# Patient Record
Sex: Male | Born: 1995 | Race: White | Hispanic: No | Marital: Single | Smoking: Current every day smoker
Health system: Southern US, Community
[De-identification: ages and names within clinical notes are randomized; demographics above are authoritative.]

## PROBLEM LIST (undated history)

## (undated) DIAGNOSIS — J45909 Unspecified asthma, uncomplicated: Secondary | ICD-10-CM

## (undated) HISTORY — PX: OTHER SURGICAL HISTORY: SHX169

---

## 2017-04-21 ENCOUNTER — Emergency Department (HOSPITAL_COMMUNITY)
Admission: EM | Admit: 2017-04-21 | Discharge: 2017-04-22 | Disposition: A | Discharge status: Home / Self Care | Payer: Medicaid Other | Attending: Emergency Medicine | Admitting: Emergency Medicine

## 2017-04-21 ENCOUNTER — Encounter (HOSPITAL_COMMUNITY): Payer: Self-pay | Admitting: Emergency Medicine

## 2017-04-21 ENCOUNTER — Emergency Department (HOSPITAL_COMMUNITY): Payer: Medicaid Other

## 2017-04-21 DIAGNOSIS — F4325 Adjustment disorder with mixed disturbance of emotions and conduct: Secondary | ICD-10-CM | POA: Diagnosis present

## 2017-04-21 DIAGNOSIS — Z046 Encounter for general psychiatric examination, requested by authority: Secondary | ICD-10-CM | POA: Insufficient documentation

## 2017-04-21 DIAGNOSIS — F17200 Nicotine dependence, unspecified, uncomplicated: Secondary | ICD-10-CM | POA: Diagnosis not present

## 2017-04-21 DIAGNOSIS — S61411A Laceration without foreign body of right hand, initial encounter: Secondary | ICD-10-CM | POA: Insufficient documentation

## 2017-04-21 DIAGNOSIS — Y9389 Activity, other specified: Secondary | ICD-10-CM | POA: Diagnosis not present

## 2017-04-21 DIAGNOSIS — Z23 Encounter for immunization: Secondary | ICD-10-CM | POA: Diagnosis not present

## 2017-04-21 DIAGNOSIS — S65911A Laceration of unspecified blood vessel at wrist and hand level of right arm, initial encounter: Secondary | ICD-10-CM | POA: Diagnosis present

## 2017-04-21 DIAGNOSIS — Z720 Tobacco use: Secondary | ICD-10-CM

## 2017-04-21 DIAGNOSIS — W25XXXA Contact with sharp glass, initial encounter: Secondary | ICD-10-CM | POA: Insufficient documentation

## 2017-04-21 DIAGNOSIS — F17210 Nicotine dependence, cigarettes, uncomplicated: Secondary | ICD-10-CM | POA: Diagnosis not present

## 2017-04-21 DIAGNOSIS — R45851 Suicidal ideations: Secondary | ICD-10-CM | POA: Diagnosis not present

## 2017-04-21 DIAGNOSIS — Y929 Unspecified place or not applicable: Secondary | ICD-10-CM | POA: Insufficient documentation

## 2017-04-21 DIAGNOSIS — Z630 Problems in relationship with spouse or partner: Secondary | ICD-10-CM | POA: Diagnosis not present

## 2017-04-21 DIAGNOSIS — Z008 Encounter for other general examination: Secondary | ICD-10-CM | POA: Insufficient documentation

## 2017-04-21 DIAGNOSIS — Y998 Other external cause status: Secondary | ICD-10-CM | POA: Insufficient documentation

## 2017-04-21 HISTORY — DX: Unspecified asthma, uncomplicated: J45.909

## 2017-04-21 LAB — COMPREHENSIVE METABOLIC PANEL
ALBUMIN: 4.9 g/dL (ref 3.5–5.0)
ALK PHOS: 67 U/L (ref 38–126)
ALT: 15 U/L — AB (ref 17–63)
AST: 23 U/L (ref 15–41)
Anion gap: 7 (ref 5–15)
BUN: 11 mg/dL (ref 6–20)
CALCIUM: 9.7 mg/dL (ref 8.9–10.3)
CHLORIDE: 107 mmol/L (ref 101–111)
CO2: 28 mmol/L (ref 22–32)
CREATININE: 1.03 mg/dL (ref 0.61–1.24)
GFR calc Af Amer: >60 mL/min (ref >60)
GFR calc non Af Amer: >60 mL/min (ref >60)
GLUCOSE: 85 mg/dL (ref 65–99)
Potassium: 3.4 mmol/L — ABNORMAL LOW (ref 3.5–5.1)
SODIUM: 142 mmol/L (ref 135–145)
Total Bilirubin: 0.8 mg/dL (ref 0.3–1.2)
Total Protein: 7.8 g/dL (ref 6.5–8.1)

## 2017-04-21 LAB — CBC WITH DIFFERENTIAL/PLATELET
BASOS ABS: 0 10*3/uL (ref 0.0–0.1)
BASOS PCT: 0 %
EOS ABS: 0.1 10*3/uL (ref 0.0–0.7)
Eosinophils Relative: 1 %
HCT: 40.9 % (ref 39.0–52.0)
HEMOGLOBIN: 14 g/dL (ref 13.0–17.0)
Lymphocytes Relative: 23 %
Lymphs Abs: 1.2 10*3/uL (ref 0.7–4.0)
MCH: 31 pg (ref 26.0–34.0)
MCHC: 34.2 g/dL (ref 30.0–36.0)
MCV: 90.7 fL (ref 78.0–100.0)
Monocytes Absolute: 0.4 10*3/uL (ref 0.1–1.0)
Monocytes Relative: 7 %
NEUTROS PCT: 69 %
Neutro Abs: 3.8 10*3/uL (ref 1.7–7.7)
Platelets: 179 10*3/uL (ref 150–400)
RBC: 4.51 MIL/uL (ref 4.22–5.81)
RDW: 12.9 % (ref 11.5–15.5)
WBC: 5.5 10*3/uL (ref 4.0–10.5)

## 2017-04-21 LAB — ETHANOL: Alcohol, Ethyl (B): <5 mg/dL (ref <5)

## 2017-04-21 LAB — SALICYLATE LEVEL: Salicylate Lvl: <7 mg/dL (ref 2.8–30.0)

## 2017-04-21 LAB — ACETAMINOPHEN LEVEL

## 2017-04-21 MED ORDER — ALUM & MAG HYDROXIDE-SIMETH 200-200-20 MG/5ML PO SUSP: 30.0000 mL | Freq: Four times a day (QID) | ORAL | Status: DC | PRN

## 2017-04-21 MED ORDER — TETANUS-DIPHTH-ACELL PERTUSSIS 5-2.5-18.5 LF-MCG/0.5 IM SUSP
0.5000 mL | Freq: Once | INTRAMUSCULAR | Status: AC
  Administered 2017-04-21: 0.5 mL via INTRAMUSCULAR
  Filled 2017-04-21: qty 0.5

## 2017-04-21 MED ORDER — ZOLPIDEM TARTRATE 5 MG PO TABS
5.0000 mg | ORAL_TABLET | Freq: Every evening | ORAL | Status: DC | PRN
  Administered 2017-04-21: 5 mg via ORAL
  Filled 2017-04-21: qty 1

## 2017-04-21 MED ORDER — ONDANSETRON HCL 4 MG PO TABS: 4.0000 mg | ORAL_TABLET | Freq: Three times a day (TID) | ORAL | Status: DC | PRN

## 2017-04-21 MED ORDER — NICOTINE 21 MG/24HR TD PT24: 21.0000 mg | MEDICATED_PATCH | Freq: Every day | TRANSDERMAL | Status: DC

## 2017-04-21 MED ORDER — IBUPROFEN 200 MG PO TABS: 600.0000 mg | ORAL_TABLET | Freq: Three times a day (TID) | ORAL | Status: DC | PRN

## 2017-04-21 NOTE — BHH Counselor (Signed)
Clinician spoke to Bruce Gibson, [place] to discuss the pt's disposition. Clinician noted there is a picture of the conversation between the pt and his now ex-girlfriend, under chart review then click the media tab.   [name], MS, [facility] Triage Specialist [phone]

## 2017-04-21 NOTE — ED Notes (Signed)
Report given to Melinda in TCU 

## 2017-04-21 NOTE — ED Notes (Signed)
EDP at bedside  

## 2017-04-21 NOTE — ED Triage Notes (Signed)
Patient here with complaints of right had laceration after punching a mirror. Bleeding controlled. Laceration noted to right middle finger.

## 2017-04-21 NOTE — ED Provider Notes (Signed)
[facility]-EMERGENCY DEPT Provider Note   CSN: [id number] Arrival date & time: 04/21/17  1752     History   Chief Complaint Chief Complaint  Patient presents with  . Extremity Laceration    HPI Bruce Gibson is a 21 y.o. Male with a PMHx of asthma, who presents to the ED with complaints of right hand laceration sustained about 30 minutes prior to arrival. Patient was angry after his girlfriend broke up with him so he punched a mirror, which subsequently broke. He sustained a small laceration between his third and fourth MCP joints on the right hand, and other very small superficial abrasions to the remainder of the hand. He describes the pain as 3/10 constant stinging nonradiating R hand pain around the laceration, worse with making a fist, and with no treatments tried prior to arrival. He is right-handed. He denies use of blood thinners or any bleeding disorders. Denies ongoing bleeding at this time. He denies any swelling, bruising, numbness, tingling, focal weakness, or any other complaints at this time. Denies any other injuries sustained.  ADDENDUM: at around 7:00pm, pt's roommate arrived to the ED stating that the pt's (now ex-)girlfriend texted him stating that the pt had sent suicidal threats to her. Pt's girlfriend then came to the ED around 7:30pm and showed [place] the texts, which stated "I'm gonna blow my brains out" and "I hurt you the thing I love most so why shouldn't I hurt myself". When asked, the pt denies SI and denies making any suicidal threats to anyone, however she was able to provide the text messages that showed that he sent that to her.   Later on when he was confronted about these texts, he admitted that he sent them and that it was because he was upset. Denies HI/AVH, and denies prior SI. Doesn't take medications. +Smoker. Admits to EtOH use occasionally, on the weekends, with the last use being yesterday. Denies illicit drug use. Denies any other medical complaints at this  time.    The history is provided by the patient and medical records. No language interpreter was used.  Laceration   The incident occurred less than 1 hour ago. The laceration is located on the right hand. The laceration is 2 cm in size. The laceration mechanism was a broken glass. The pain is at a severity of 3/10. The pain is mild. The pain has been constant since onset. He reports no foreign bodies present. His tetanus status is unknown.    Past Medical History:  Diagnosis Date  . Asthma     There are no active problems to display for this patient.   No past surgical history on file.     Home Medications    Prior to Admission medications   Not on File    Family History No family history on file.  Social History Social History  Substance Use Topics  . Smoking status: Current Some Day Smoker  . Smokeless tobacco: Never Used  . Alcohol use No     Allergies   Patient has no allergy information on record.   Review of Systems Review of Systems  Constitutional: Negative for chills and fever.  Respiratory: Negative for shortness of breath.   Cardiovascular: Negative for chest pain.  Gastrointestinal: Negative for abdominal pain, constipation, diarrhea, nausea and vomiting.  Genitourinary: Negative for dysuria and hematuria.  Musculoskeletal: Positive for arthralgias. Negative for joint swelling.  Skin: Positive for wound. Negative for color change.  Allergic/Immunologic: Negative for immunocompromised state.  Neurological:  Negative for weakness and numbness.  Hematological: Does not bruise/bleed easily.  Psychiatric/Behavioral: Positive for self-injury and suicidal ideas (per girlfriend based on texts he sent her, and later he admits making threats). Negative for confusion and hallucinations.  All other systems reviewed and are negative for acute change except as noted in the HPI.     Physical Exam Updated Vital Signs BP (!) 138/95 (BP Location: Right Arm)    Pulse (!) 104   Temp 98.4 F (36.9 C) (Oral)   Resp 18   SpO2 100%   Physical Exam  Constitutional: He is oriented to person, place, and time. Vital signs are normal. He appears well-developed and well-nourished.  Non-toxic appearance. No distress.  Afebrile, nontoxic, NAD  HENT:  Head: Normocephalic and atraumatic.  Mouth/Throat: Oropharynx is clear and moist and mucous membranes are normal.  Eyes: Conjunctivae and EOM are normal. Right eye exhibits no discharge. Left eye exhibits no discharge.  Neck: Normal range of motion. Neck supple.  Cardiovascular: Normal rate, regular rhythm, normal heart sounds and intact distal pulses.  Exam reveals no gallop and no friction rub.   No murmur heard. Pulmonary/Chest: Effort normal and breath sounds normal. No respiratory distress. He has no decreased breath sounds. He has no wheezes. He has no rhonchi. He has no rales.  Abdominal: Soft. Normal appearance and bowel sounds are normal. He exhibits no distension. There is no tenderness. There is no rigidity, no rebound, no guarding, no CVA tenderness, no tenderness at McBurney's point and negative Murphy's sign.  Musculoskeletal: Normal range of motion.       Right hand: He exhibits tenderness and laceration. He exhibits normal range of motion, no bony tenderness, normal capillary refill, no deformity and no swelling. Normal sensation noted. Normal strength noted.  R hand with small laceration between 3rd and 4th MCP joints, fairly superficial with one edge of the wound missing a portion of skin, and the other edge of the wound has two small skin flaps still attached and covering over the slightly deeper area underlying it; no retained FBs, no ongoing bleeding, no swelling or bruising. Small scattered abrasions to remainder of hand. FROM intact in all digits, no crepitus or deformity, no focal bony TTP, only mild TTP over the wound. Strength and sensation grossly intact, distal pulses intact, compartments  soft.  SEE PICTURE BELOW  Neurological: He is alert and oriented to person, place, and time. He has normal strength. No sensory deficit.  Skin: Skin is warm and dry. Abrasion and laceration noted. No rash noted.  R hand wounds as mentioned above and pictured below  Psychiatric: He is not actively hallucinating. He exhibits a depressed mood. He expresses suicidal ideation. He expresses no homicidal ideation. He expresses suicidal plans. He expresses no homicidal plans.  Depressed affect. Initially denies SI despite texting his girlfriend suicidal threats, then admits sending suicidal threats. Denies HI or AVH, doesn't seem to be responding to internal stimuli.   Nursing note and vitals reviewed.   AFTER SKIN FLAP STRAIGHTENED OUT:    BEFORE SKIN FLAP STRAIGHTENED OUT:     ED Treatments / Results  Labs (all labs ordered are listed, but only abnormal results are displayed) Labs Reviewed  COMPREHENSIVE METABOLIC PANEL - Abnormal; Notable for the following:       Result Value   Potassium 3.4 (*)    ALT 15 (*)    All other components within normal limits  ACETAMINOPHEN LEVEL - Abnormal; Notable for the following:  Acetaminophen (Tylenol), Serum <10 (*)    All other components within normal limits  CBC WITH DIFFERENTIAL/PLATELET  ETHANOL  SALICYLATE LEVEL  RAPID URINE DRUG SCREEN, HOSP PERFORMED    EKG  EKG Interpretation None       Radiology Dg Hand Complete Right  Result Date: 04/21/2017 CLINICAL DATA:  Laceration after trauma. EXAM: RIGHT HAND - COMPLETE 3+ VIEW COMPARISON:  None. FINDINGS: There is no evidence of fracture or dislocation. There is no evidence of arthropathy or other focal bone abnormality. Soft tissues are unremarkable. IMPRESSION: Negative. Electronically Signed   By: Gerome Sam III M.D   On: 04/21/2017 18:51    Procedures Procedures (including critical care time)  Medications Ordered in ED Medications  ibuprofen (ADVIL,MOTRIN) tablet 600 mg  (not administered)  zolpidem (AMBIEN) tablet 5 mg (not administered)  ondansetron (ZOFRAN) tablet 4 mg (not administered)  alum & mag hydroxide-simeth (MAALOX/MYLANTA) 200-200-20 MG/5ML suspension 30 mL (not administered)  nicotine (NICODERM CQ - dosed in mg/24 hours) patch 21 mg (not administered)  Tdap (BOOSTRIX) injection 0.5 mL (0.5 mLs Intramuscular Given 04/21/17 1847)     Initial Impression / Assessment and Plan / ED Course  I have reviewed the triage vital signs and the nursing notes.  Pertinent labs & imaging results that were available during my care of the patient were reviewed by me and considered in my medical decision making (see chart for details).     21 y.o. male here with small laceration to R hand between 3rd and 4th knuckles after punching a mirror in anger. Mild pain, no swelling or bruising, no focal bony tenderness, but mild TTP over the wound, fairly superficial wound with slightly deeper portion where a skin flap is still attached, and then a superficial portion where the skin has been removed entirely. Multiple smaller abrasions to remainder of hand. No retained FBs visualized in wound bed. NVI with soft compartments, FROM intact in all digits. Will obtain xray, update Tdap, and cleanse wound. Doubt need for wound closure, it will likely need to heal by secondary intent given the fact that there are missing skin flaps, and the flap that remains covers the deeper portion of the wound, which is still fairly superficial. Pt declines wanting pain meds. Will reassess shortly.   7:45 PM Xray negative, no retained FBs or osseous injury. Wound should heal well on its own by secondary intent, not amendable to sutures or dermabond. Cleansed and dressed.  During the interim time however, pt's roommate came and advised that pt had sent suicidal threats to his (now ex-)girlfriend. Pt's ex-girlfriend Bruce Gibson [phone]) was able to come and show the texts to [place] (which are loaded  under media tab as a clinical photo), and he in fact did make suicidal threats. He initially denies SI and denies making any threats when he's asked, but later when he's confronted about it, he admits it. He has a depressed affect. Given this information, IVC paperwork has been done and we will start the psychiatric clearance. Pt understands and is willing to be evaluated. IVC paperwork still sent in at this time. Will get clearance labs done. Will reassess shortly.   9:42 PM EtOH level undetectable. CBC w/diff and CMP essentially unremarkable, K slightly low at 3.4 but should not need repletion. Salicylate and acetaminophen levels WNL.  UDS pending, but does not interfere with med clearance. Pt medically cleared at this time. Psych hold orders and home med orders placed. Please see TTS notes for further  documentation of care/dispo. Pt stable at time of med clearance.     Final Clinical Impressions(s) / ED Diagnoses   Final diagnoses:  Laceration of right hand without foreign body, initial encounter  Suicidal ideations  Medical clearance for psychiatric admission  Tobacco user  Involuntary commitment    New Prescriptions New Prescriptions   No medications on [address], [place], [place] 04/21/17 2143    Tegeler, Canary Brim, MD 04/22/17 [phone]

## 2017-04-21 NOTE — ED Notes (Signed)
Bed: [facility] Expected date:  Expected time:  Means of arrival:  Comments: [place]

## 2017-04-21 NOTE — BHH Counselor (Signed)
Per pt's RN, pt's mother would like the doctor call her in the morning. Per RN pt has given is consent to contact his mother.   Bruce Gibson, mother, [phone].  [name], MS, [facility], [facility] Triage Specialist [phone]

## 2017-04-21 NOTE — BH Assessment (Addendum)
Assessment Note  Bruce Gibson is an 21 y.o. male, who presents involuntary and unaccompanied to [facility]. Clinician asked the pt, "what brought you to the hospital?" Pt replied, "I punched a mirror and cut my hand up." Pt reported, because, "the girl I like broke up with me." Pt denied, SI, HI, AVH, self-injurious behaviors and access to weapons. Clinician expressed to the pt that she seen that he made threats against himself. Pt reported, he was just upset.   Pt's IVC paperwork was initiated by EDP. Per IVC paperwork: "Pt punched a mirror in anger his girlfriend broke up with his him. He then texted her that he was 'gonna blow (his) brains out' and 'I hurt you the thing I love the most so why shouldn't I hurt myself.' He [place] making suicidal threats to her via text , despite denying to medical staff. He is likely a threat to himself."   Pt denied abuse. Pt reported, this week he has been smoking seven cigarettes daily. Pt's UDS is pending. Pt denied being linked to OPT resources (medication management and counseling.) Pt denied previous inpatient admissions.   Pt presents quiet/awake in scrubs with logical/cohernet speech. Pt's eye contact was fair. Pt's mood was sad. Pt's affect was congruent with mood. Pt's thought process was relevant/coherent. Pt's judgement was unimpaired. Pt's concentration was normal. Pt's insight and impulse control are poor. Pt was oriented x3 (year, city and state.) Pt reported, if discharged from [facility] he could contract for safety.   Diagnosis: Major Depressed Disorder, Single, Severe without Psychotic Features.   Past Medical History:  Past Medical History:  Diagnosis Date  . Asthma     No past surgical history on file.  Family History: No family history on file.  Social History:  reports that he has been smoking.  He has never used smokeless tobacco. He reports that he does not drink alcohol. His drug history is not on file.  Additional Social History:  Alcohol /  Drug Use Pain Medications: See MAR  Prescriptions: See MAR Over the Counter: See MAR History of alcohol / drug use?:  (Pt's UDS is pending. )  CIWA: CIWA-Ar BP: (!) 138/95 Pulse Rate: (!) 104 COWS:    Allergies: No Known Allergies  Home Medications:  (Not in a hospital admission)  OB/GYN Status:  No LMP for male patient.  General Assessment Data TTS Assessment: In system Is this a Tele or Face-to-Face Assessment?: Face-to-Face Is this an Initial Assessment or a Re-assessment for this encounter?: Initial Assessment Marital status: Single Living Arrangements: Other (Comment) (with three roommates. ) Can pt return to current living arrangement?: Yes Admission Status: Involuntary Referral Source: Self/Family/Friend Insurance type: Self-pay     Crisis Care Plan Living Arrangements: Other (Comment) (with three roommates. ) Legal Guardian: Other: (Self) Name of Psychiatrist: NA Name of Therapist: NA  Education Status Is patient currently in school?: Yes Current Grade: Sophomore in college.  Highest grade of school patient has completed: Freshman in college. Name of school: [employer]. Contact person: NA  Risk to self with the past 6 months Suicidal Ideation: Yes-Currently Present (Per IVC however pt denies. ) Has patient been a risk to self within the past 6 months prior to admission? : Yes Suicidal Intent: Yes-Currently Present (Per IVC however pt denies. ) Has patient had any suicidal intent within the past 6 months prior to admission? : Yes (Per IVC however pt denies. ) Is patient at risk for suicide?: Yes Suicidal Plan?: Yes-Currently Present (Per IVC however  pt denies. ) Has patient had any suicidal plan within the past 6 months prior to admission? : No (Pt denies. ) Specify Current Suicidal Plan: Per IVC pt reported, he was "gonn blow his brains out."  Access to Means: No (Pt denies. ) What has been your use of drugs/alcohol within the last 12 months?: UDS is  pending. Previous Attempts/Gestures: No How many times?: 0 Other Self Harm Risks: Pt denies.  Triggers for Past Attempts: None known Intentional Self Injurious Behavior: Cutting (Pt cut his hand because he punched a mirror. ) Comment - Self Injurious Behavior: Pt cut his hand because he punched a mirror.  Family Suicide History: No Recent stressful life event(s): Other (Comment) (depression.) Persecutory voices/beliefs?: No Depression: Yes Depression Symptoms: Feeling worthless/self pity (sadness, low self-esteem. ) Substance abuse history and/or treatment for substance abuse?: No Suicide prevention information given to non-admitted patients: Not applicable  Risk to Others within the past 6 months Homicidal Ideation: No (Pt denies. ) Does patient have any lifetime risk of violence toward others beyond the six months prior to admission? : No Thoughts of Harm to Others: No Current Homicidal Intent: No Current Homicidal Plan: No Access to Homicidal Means: No Identified Victim: NA History of harm to others?: No (Pt denies. ) Assessment of Violence: None Noted Violent Behavior Description: NA Does patient have access to weapons?: No (Pt denies. ) Criminal Charges Pending?: No Does patient have a court date: No Is patient on probation?: No  Psychosis Hallucinations: None noted Delusions: None noted  Mental Status Report Appearance/Hygiene: In scrubs Eye Contact: Fair Motor Activity: Unremarkable Speech: Logical/coherent Level of Consciousness: Quiet/awake Mood: Sad Affect: Other (Comment) (congruent with mood. ) Anxiety Level: None Thought Processes: Relevant, Coherent Judgement: Unimpaired Orientation: Other (Comment) (year, city and state.) Obsessive Compulsive Thoughts/Behaviors: None  Cognitive Functioning Concentration: Normal Memory: Recent Intact IQ: Average Insight: Poor Impulse Control: Poor Appetite: Good Sleep: No Change Vegetative Symptoms:  None  ADLScreening [facility]) Patient's cognitive ability adequate to safely complete daily activities?: Yes Patient able to express need for assistance with ADLs?: Yes Independently performs ADLs?: Yes (appropriate for developmental age)  Prior Inpatient Therapy Prior Inpatient Therapy: No Prior Therapy Dates: NA Prior Therapy Facilty/Provider(s): NA Reason for Treatment: NA  Prior Outpatient Therapy Prior Outpatient Therapy: No Prior Therapy Dates: NA Prior Therapy Facilty/Provider(s): NA Reason for Treatment: NA Does patient have an ACCT team?: No Does patient have Intensive In-House Services?  : No Does patient have [facility] services? : No Does patient have P4CC services?: No  ADL Screening (condition at time of admission) Patient's cognitive ability adequate to safely complete daily activities?: Yes Is the patient deaf or have difficulty hearing?: No Does the patient have difficulty seeing, even when wearing glasses/contacts?: Yes (Pt reported, wearing contacts. ) Does the patient have difficulty concentrating, remembering, or making decisions?: Yes (Pt reported, sometimes. ) Patient able to express need for assistance with ADLs?: Yes Does the patient have difficulty dressing or bathing?: No Independently performs ADLs?: Yes (appropriate for developmental age) Does the patient have difficulty walking or climbing stairs?: No Weakness of Legs: None Weakness of Arms/Hands: None       Abuse/Neglect Assessment (Assessment to be complete while patient is alone) Physical Abuse: Denies (Pt denies.) Verbal Abuse: Denies (Pt denies.) Sexual Abuse: Denies (Pt denies. ) Exploitation of patient/patient's resources: Denies (Pt denies. ) Self-Neglect: Denies (Pt denies. )     Advance Directives (For Healthcare) Does Patient Have a Medical Advance Directive?: No  Additional Information 1:1 In Past 12 Months?: No CIRT Risk: No Elopement Risk: No Does patient  have medical clearance?: Yes     Disposition: Nira Conn, NP recommends inpatient treatment. Disposition discussed with France Ravens, PA and Juliette Alcide, Charity fundraiser. TTS to seek placement.    Disposition Initial Assessment Completed for this Encounter: Yes Disposition of Patient:  (Pending.)  On Site Evaluation by:   Reviewed with Physician: France Ravens, PA and Nira Conn, NP.    [name] 04/21/2017 11:04 PM   [name], MS, [facility], [facility] Triage Specialist [phone]

## 2017-04-21 NOTE — ED Notes (Addendum)
SBAR Report received from previous nurse. Pt received calm and visible on unit. Pt denies current SI/ HI, A/V H, depression, anxiety, or pain at this time, and appears otherwise stable and free of distress at this time. Written permission granted to give information to mother. When speaking to  the mother, she confirmed that pt does not have a history of depression, self harm, or any psych issues. Mother provided phone number and requests to be contacted in the morning by treatment team. Pt reminded of camera surveillance, q 15 min rounds, and rules of the milieu. Pt has contracted for safety, will continue to assess.

## 2017-04-22 DIAGNOSIS — F4325 Adjustment disorder with mixed disturbance of emotions and conduct: Secondary | ICD-10-CM | POA: Diagnosis not present

## 2017-04-22 DIAGNOSIS — Z630 Problems in relationship with spouse or partner: Secondary | ICD-10-CM

## 2017-04-22 DIAGNOSIS — F17210 Nicotine dependence, cigarettes, uncomplicated: Secondary | ICD-10-CM

## 2017-04-22 LAB — RAPID URINE DRUG SCREEN, HOSP PERFORMED
Amphetamines: NOT DETECTED
Barbiturates: NOT DETECTED
Benzodiazepines: NOT DETECTED
COCAINE: NOT DETECTED
OPIATES: NOT DETECTED
TETRAHYDROCANNABINOL: POSITIVE — AB

## 2017-04-22 MED ORDER — HYDROXYZINE HCL 25 MG PO TABS: 25.0000 mg | ORAL_TABLET | Freq: Four times a day (QID) | ORAL | 0 refills | Status: DC | PRN

## 2017-04-22 MED ORDER — HYDROXYZINE HCL 25 MG PO TABS: 25.0000 mg | ORAL_TABLET | Freq: Four times a day (QID) | ORAL | Status: DC | PRN

## 2017-04-22 NOTE — BH Assessment (Signed)
BHH Assessment Progress Note  Per Thedore Mins, MD, this pt does not require psychiatric hospitalization at this time.  Original IVC documents, initiated on 04/21/2017, were found on pt's chart, but no Findings and Custody Order was found, and after calling Ok Edwards, this Clinical research associate ascertained that documents had never been faxed to the [employer] office.  Pt is therefore under voluntary status.  Pt is to be discharged from [facility] with recommendation to follow up with the [facility] counseling center.  This has been included in pt's discharge instructions.  Pt's nurse, Morrie Sheldon, has been notified.  Pt has signed Consent to Release Information to the [facility] counseling center, and this writer placed a notification call, leaving a message on Shelby's voice mail.  Doylene Canning, MA Triage Specialist [phone]
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
## 2017-04-22 NOTE — Discharge Instructions (Signed)
For your behavioral health needs, you are advised to follow up with the counseling center at [facility].  Contact them at your earliest opportunity to schedule an intake appointment:       Counseling Center      [facility]      [facility] [facility] [place]      [address]      [place], [place] [address]      [phone]

## 2017-04-22 NOTE — BH Assessment (Signed)
[facility] Assessment Progress Note This Clinical research associate contacted patients mother (per patients request) Abiel Odeh [phone] to discuss patient's case. Mother stated she feels patient is currently not a threat to himself or others and further hospitalization she feels, would not be necessary . Mother states she has spoke with patient several times since patient was admitted and patient has contracted for safety with him expressing the desire to be discharged this date and follow up with a outpatient provider at [facility]. Mother informed this Clinical research associate that patient has a strong support system and is motivated for treatment. Patient is currently attending school at [employer] and has stated that he "needs to get back to his classes's" and is interested in receiving counseling and be evaluated for possible medication interventions. Patient contracts for safety and will be discharged later this date. Staff will assist with coordinating services at [facility] prior to discharge.

## 2017-04-22 NOTE — BHH Suicide Risk Assessment (Signed)
Suicide Risk Assessment  Discharge Assessment   [facility] Discharge Suicide Risk Assessment   Principal Problem: Adjustment disorder with mixed disturbance of emotions and conduct Discharge Diagnoses:  Patient Active Problem List   Diagnosis Date Noted  . Adjustment disorder with mixed disturbance of emotions and conduct [F43.25] 04/22/2017    Priority: High    Total Time spent with patient: 45 minutes   Musculoskeletal: Strength & Muscle Tone: within normal limits Gait & Station: normal Patient leans: N/A  Psychiatric Specialty Exam: Physical Exam  Constitutional: He is oriented to person, place, and time. He appears well-developed and well-nourished.  HENT:  Head: Normocephalic.  Neck: Normal range of motion.  Respiratory: Effort normal.  Musculoskeletal: Normal range of motion.  Neurological: He is alert and oriented to person, place, and time.  Psychiatric: He has a normal mood and affect. His speech is normal and behavior is normal. Judgment and thought content normal. Cognition and memory are normal.    Review of Systems  All other systems reviewed and are negative.   Blood pressure 124/76, pulse 71, temperature 98.1 F (36.7 C), temperature source Oral, resp. rate 18, SpO2 96 %.There is no height or weight on file to calculate BMI.  General Appearance: Casual  Eye Contact:  Good  Speech:  Normal Rate  Volume:  Normal  Mood:  sad  Affect:  Congruent  Thought Process:  Coherent and Descriptions of Associations: Intact  Orientation:  Full (Time, Place, and Person)  Thought Content:  WDL and Logical  Suicidal Thoughts:  No  Homicidal Thoughts:  No  Memory:  Immediate;   Good Recent;   Good Remote;   Good  Judgement:  Fair  Insight:  Good  Psychomotor Activity:  Normal  Concentration:  Concentration: Good and Attention Span: Good  Recall:  Good  Fund of Knowledge:  Good  Language:  Good  Akathisia:  No  Handed:  Right  AIMS (if indicated):     Assets:   Communication Skills Desire for Improvement Financial Resources/Insurance Housing Leisure Time Physical Health Resilience Social Support Talents/Skills Transportation Vocational/Educational  ADL's:  Intact  Cognition:  WNL  Sleep:      Mental Status Per Nursing Assessment::   On Admission:   upset when his girlfriend broke up with him  Demographic Factors:  Adolescent or young adult and Caucasian  Loss Factors: Loss of significant relationship  Historical Factors: NA  Risk Reduction Factors:   Sense of responsibility to family, Living with another person, especially a relative, Positive social support and Positive coping skills or problem solving skills  Continued Clinical Symptoms:  Sad, situational  Cognitive Features That Contribute To Risk:  None    Suicide Risk:  Minimal: No identifiable suicidal ideation.  Patients presenting with no risk factors but with morbid ruminations; may be classified as minimal risk based on the severity of the depressive symptoms    Plan Of Care/Follow-up recommendations:  Activity:  as tolerateed Diet:  heart healthy diet  Merridith Dershem, NP 04/22/2017, 10:54 AM

## 2017-04-22 NOTE — ED Notes (Addendum)
Pt d/c home per MD order. Pt denies SI/HI/AVH. Discharge summary reviewed with pt, pt verbalizes understanding. RX provided. Pt signed for personal property and property returned. Pt signed e-signature. Ambulatory off unit with MHT.

## 2017-07-14 ENCOUNTER — Other Ambulatory Visit: Payer: Self-pay

## 2017-07-14 ENCOUNTER — Emergency Department (HOSPITAL_COMMUNITY)
Admission: EM | Admit: 2017-07-14 | Discharge: 2017-07-14 | Disposition: A | Discharge status: Home / Self Care | Payer: Federal, State, Local not specified - PPO | Attending: Emergency Medicine | Admitting: Emergency Medicine

## 2017-07-14 ENCOUNTER — Emergency Department (HOSPITAL_COMMUNITY): Payer: Federal, State, Local not specified - PPO

## 2017-07-14 ENCOUNTER — Encounter (HOSPITAL_COMMUNITY): Payer: Self-pay | Admitting: Emergency Medicine

## 2017-07-14 DIAGNOSIS — R05 Cough: Secondary | ICD-10-CM | POA: Diagnosis not present

## 2017-07-14 DIAGNOSIS — F17210 Nicotine dependence, cigarettes, uncomplicated: Secondary | ICD-10-CM | POA: Insufficient documentation

## 2017-07-14 DIAGNOSIS — R0781 Pleurodynia: Secondary | ICD-10-CM | POA: Diagnosis not present

## 2017-07-14 DIAGNOSIS — J45909 Unspecified asthma, uncomplicated: Secondary | ICD-10-CM | POA: Insufficient documentation

## 2017-07-14 DIAGNOSIS — R0981 Nasal congestion: Secondary | ICD-10-CM | POA: Diagnosis not present

## 2017-07-14 MED ORDER — IBUPROFEN 800 MG PO TABS: 800.0000 mg | ORAL_TABLET | Freq: Three times a day (TID) | ORAL | 0 refills | Status: AC | PRN

## 2017-07-14 MED ORDER — FLUTICASONE PROPIONATE 50 MCG/ACT NA SUSP: 1.0000 | Freq: Every day | NASAL | 2 refills | Status: AC

## 2017-07-14 MED ORDER — BENZONATATE 100 MG PO CAPS: 100.0000 mg | ORAL_CAPSULE | Freq: Three times a day (TID) | ORAL | 0 refills | Status: AC | PRN

## 2017-07-14 NOTE — Discharge Instructions (Signed)
You were seen in the emergency department today for right-sided rib pain.  Your x-ray did not show any abnormalities.  Use the Flonase prescribed daily with 1 spray in each nostril each to help with congestion.  Take the Tessalon every 8 hours as needed to help with cough.  Take the ibuprofen once every 8 hours as needed for pain.The ibuprofen is a nonsteroidal anti-inflammatory medication, it should be taken with food, as it can cause stomach upset, and more seriously, stomach bleeding. You may supplement with Tylenol, do not take more than 8 of the extra strength (500mg ) pills a day.  Follow-up with the primary care provider provided on your discharge instructions within 1 week if your symptoms have not improved.  Return to the emergency department for any new or worsening symptoms including but not limited to chest pain shortness of breath or if you pass out.

## 2017-07-14 NOTE — ED Provider Notes (Signed)
[facility]   CSN: [id number] Arrival date & time: 07/14/17  2123     History   Chief Complaint Chief Complaint  Patient presents with  . Chest Pain    HPI Bruce MayerGabriel Gibson is a 21 y.o. male with a hx of asthma who presents with a chief complaint of right-sided rib pain for the past week, that worsened yesterday.  Patient states he has had congestion and a cough for the past month, treated with prednisone 2 weeks prior with some improvement. Has had 2 events where he has felt a popping sensation in his right ribs with coughing which is followed by a constant discomfort.  The pain in his ribs is worse with deep breathing, coughing, and movement.  Patient has tried 400 mg of ibuprofen at home with minimal relief.  Denies fevers, chills, dyspnea, palpitations, or chest pain.   HPI  Past Medical History:  Diagnosis Date  . Asthma     Patient Active Problem List   Diagnosis Date Noted  . Adjustment disorder with mixed disturbance of emotions and conduct 04/22/2017    Past Surgical History:  Procedure Laterality Date  . extraction of wisdom teeth         Home Medications    Prior to Admission medications   Medication Sig Start Date End Date Taking? Authorizing Provider  albuterol (PROVENTIL HFA;VENTOLIN HFA) 108 (90 Base) MCG/ACT inhaler Inhale 2 puffs every 6 (six) hours as needed into the lungs for wheezing or shortness of breath.   Yes [provider]  ibuprofen (ADVIL,MOTRIN) 200 MG tablet Take 400 mg every 6 (six) hours as needed by mouth for moderate pain.   Yes [provider]  hydrOXYzine (ATARAX/VISTARIL) 25 MG tablet Take 1 tablet (25 mg total) by mouth every 6 (six) hours as needed for anxiety. 04/22/17   Charm RingsLord, Jamison Y, NP    Family History Family History  Problem Relation Age of Onset  . Cancer Other   . CAD Other     Social History Social History   Tobacco Use  . Smoking status:  Current Every Day Smoker    Types: Cigarettes  . Smokeless tobacco: Never Used  Substance Use Topics  . Alcohol use: Yes    Comment: social   . Drug use: Yes    Types: Marijuana     Allergies   Shellfish allergy   Review of Systems Review of Systems  Constitutional: Negative for diaphoresis and fever.  HENT: Positive for congestion and postnasal drip. Negative for sinus pain and sore throat.   Respiratory: Positive for cough. Negative for shortness of breath and wheezing.   Cardiovascular: Negative for chest pain and palpitations.  Gastrointestinal: Negative for abdominal distention, diarrhea, nausea and vomiting.  Musculoskeletal:       Positive for R rib pain.   Skin: Negative for rash.  All other systems reviewed and are negative.    Physical Exam Updated Vital Signs BP 131/83 (BP Location: Left Arm)   Pulse 79   Temp 97.8 F (36.6 C) (Oral)   Resp 18   SpO2 100%   Physical Exam  Constitutional: He appears well-developed and well-nourished.  Non-toxic appearance. No distress.  HENT:  Head: Normocephalic and atraumatic.  Right Ear: Tympanic membrane is not erythematous, not retracted and not bulging.  Left Ear: Tympanic membrane is not erythematous, not retracted and not bulging.  Nose: Rhinorrhea (and congestion) present.  Mouth/Throat: Oropharynx is clear and moist. No oropharyngeal exudate  or posterior oropharyngeal erythema.  Eyes: Conjunctivae are normal. Right eye exhibits no discharge. Left eye exhibits no discharge.  Neck: Neck supple.  Cardiovascular: Normal rate and regular rhythm. Exam reveals no gallop and no friction rub.  No murmur heard. Pulmonary/Chest: Effort normal and breath sounds normal. No respiratory distress. He has no wheezes. He has no rhonchi. He has no rales.  Tenderness to palpation over right sided ribs anteriorly- approximately ribs 5-7. No crepitus. No step-off .  Abdominal: Soft. He exhibits no distension. There is no tenderness.    Lymphadenopathy:    He has no cervical adenopathy.  Neurological: He is alert.  Clear speech.   Skin: Skin is warm and dry.  Psychiatric: He has a normal mood and affect. His behavior is normal. Thought content normal.  Nursing note and vitals reviewed.    ED Treatments / Results  Radiology: Dg Ribs Unilateral W/chest Right  Result Date: 07/14/2017 CLINICAL DATA:  21 year old male with right rib pain. EXAM: RIGHT RIBS AND CHEST - 3+ VIEW COMPARISON:  None FINDINGS: The lungs are clear. There is no pleural effusion or pneumothorax. The cardiac silhouette is within normal limits. No acute osseous pathology. No rib fracture. IMPRESSION: Negative. Electronically Signed   By: Elgie CollardArash  Radparvar M.D.   On: 07/14/2017 23:32   Medications Ordered in ED Medications - No data to display   Initial Impression / Assessment and Plan / ED Course  I have reviewed the triage vital signs and the nursing notes.  Pertinent labs & imaging results that were available during my care of the patient were reviewed by me and considered in my medical decision making (see chart for details).   Patient presents with complaint of right-sided rib pain secondary to excess cough over the past 1 month.  Patient is nontoxic-appearing with stable vital signs.  Does not appear to be in any acute distress.  X-ray negative for fracture.  Will treat patient symptomatically with Flonase for nasal congestion, Tessalon for cough, and ibuprofen for pain.  Discussed x-ray results and plan with patient, provided opportunity for questions, he confirmed understanding and is comfortable with plan for discharge home.   Final Clinical Impressions(s) / ED Diagnoses   Final diagnoses:  Rib pain on right side    ED Discharge Orders        Ordered    fluticasone (FLONASE) 50 MCG/ACT nasal spray  Daily     07/14/17 2321    benzonatate (TESSALON) 100 MG capsule  3 times daily PRN     07/14/17 2321    ibuprofen (ADVIL,MOTRIN) 800 MG  tablet  Every 8 hours PRN     07/14/17 2321       Cherly Andersonetrucelli, Asianna Brundage R, PA-C 07/14/17 2339    Charlynne PanderYao, David Hsienta, MD 07/14/17 [phone]

## 2017-07-14 NOTE — ED Triage Notes (Signed)
Pt states he has had a cough for the past month and a half and about 3 weeks ago he took a round of steroids  Pt states right before he started the steroids he was coughing and felt a pop in his right ribs  Pt states the pain was getting better but this morning he coughed and he felt it pop again  Pt is c/o right rib pain and states it hurts when he walks

## 2019-07-10 IMAGING — CR DG RIBS W/ CHEST 3+V*R*
5 series · 5 of 5 positions shown · non-contrast
Comparison: None

CLINICAL DATA: 21-year-old male with right rib pain.

EXAM:
RIGHT RIBS AND CHEST - 3+ VIEW

[w chest pa]
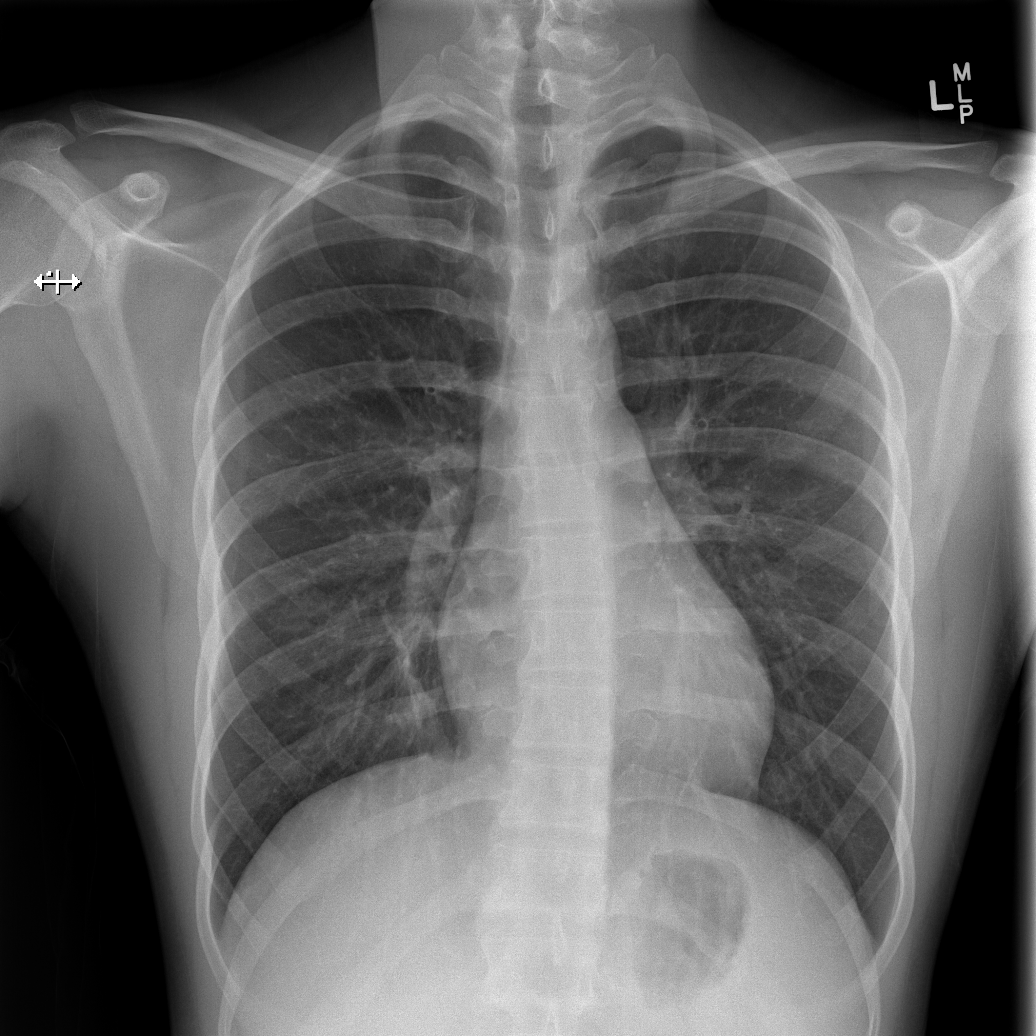

[w ribs ap upper right]
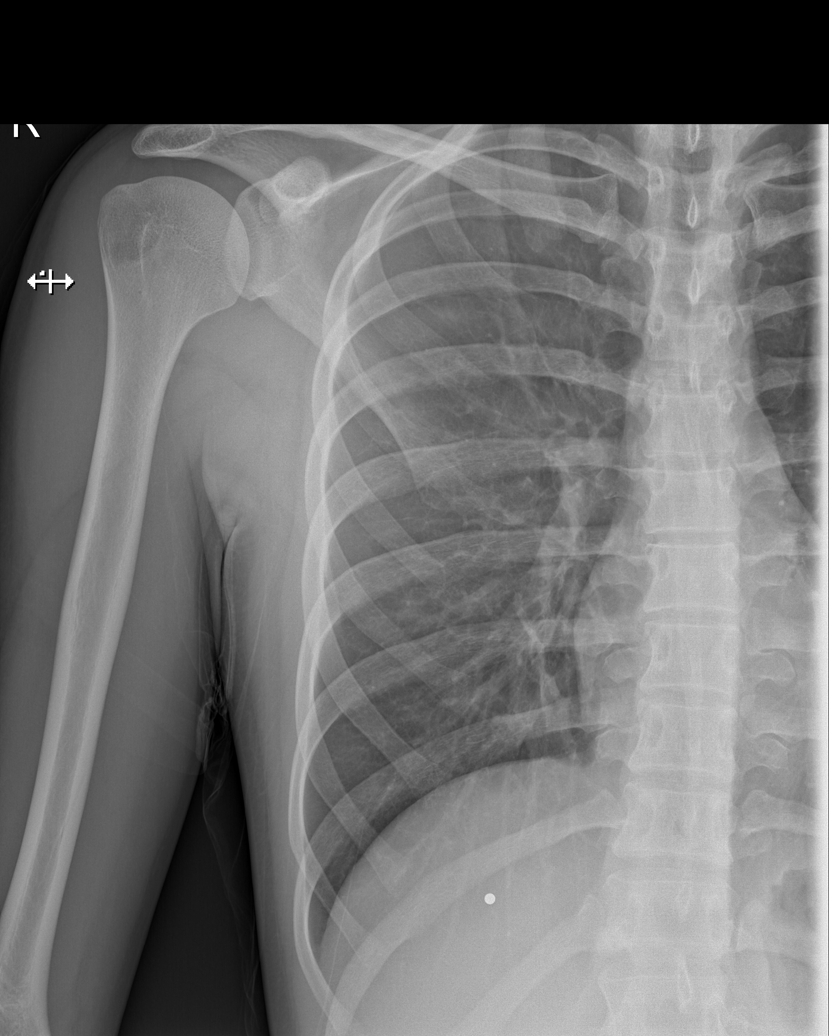

[w ribs ap lower right]
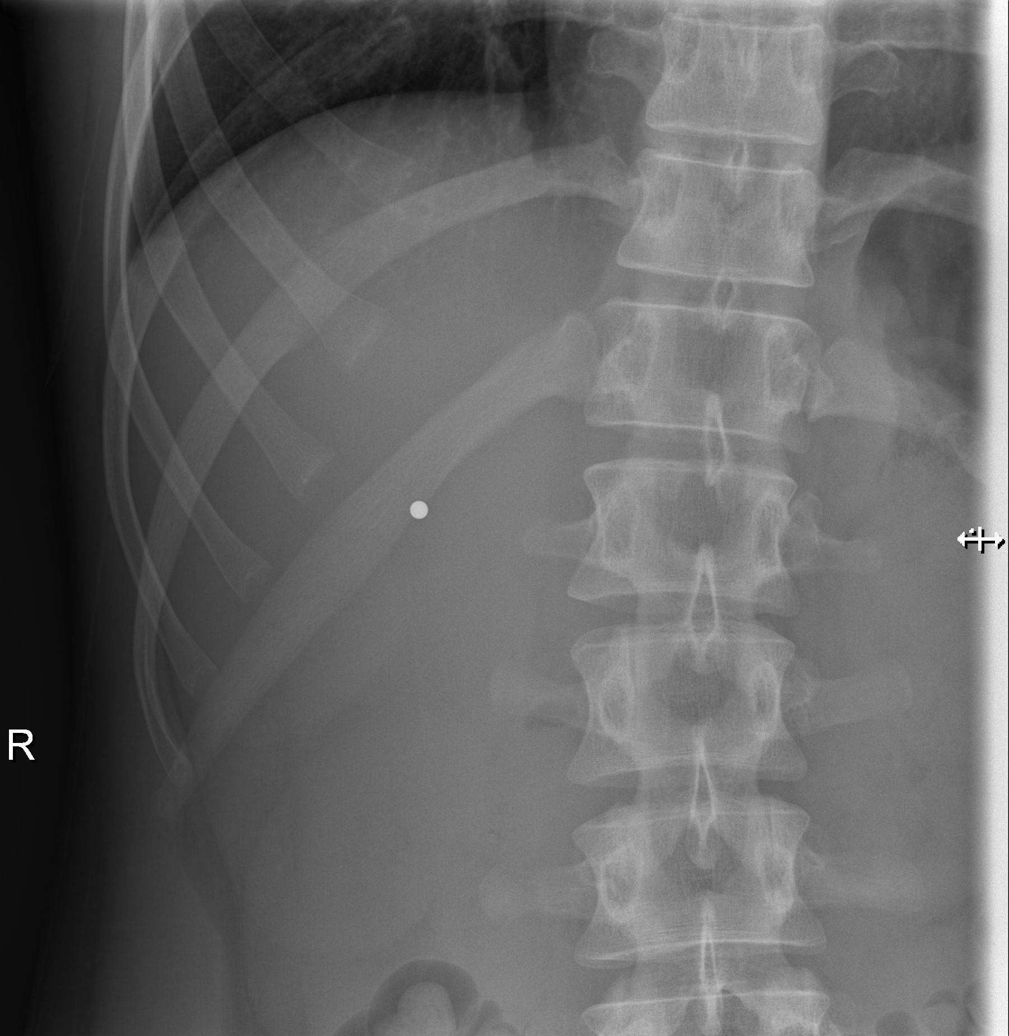

[w ribs obl right (1 of 2)]
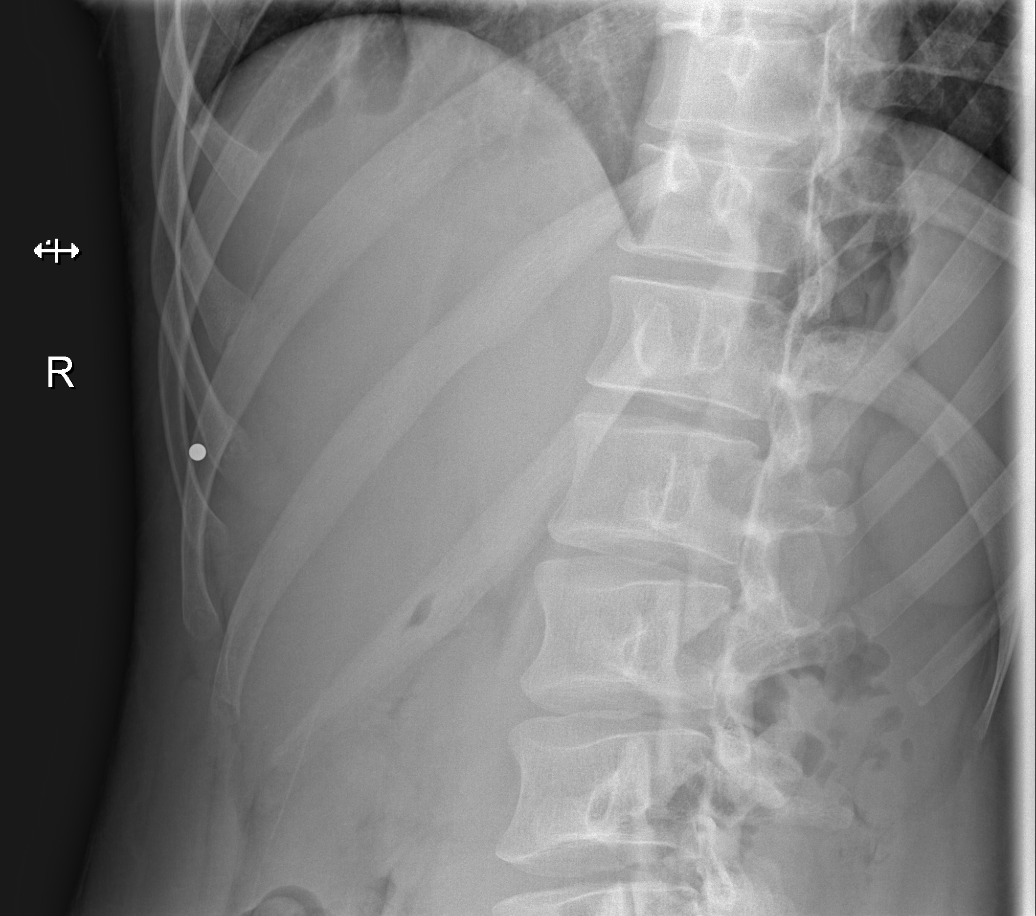

[w ribs obl right (2 of 2)]
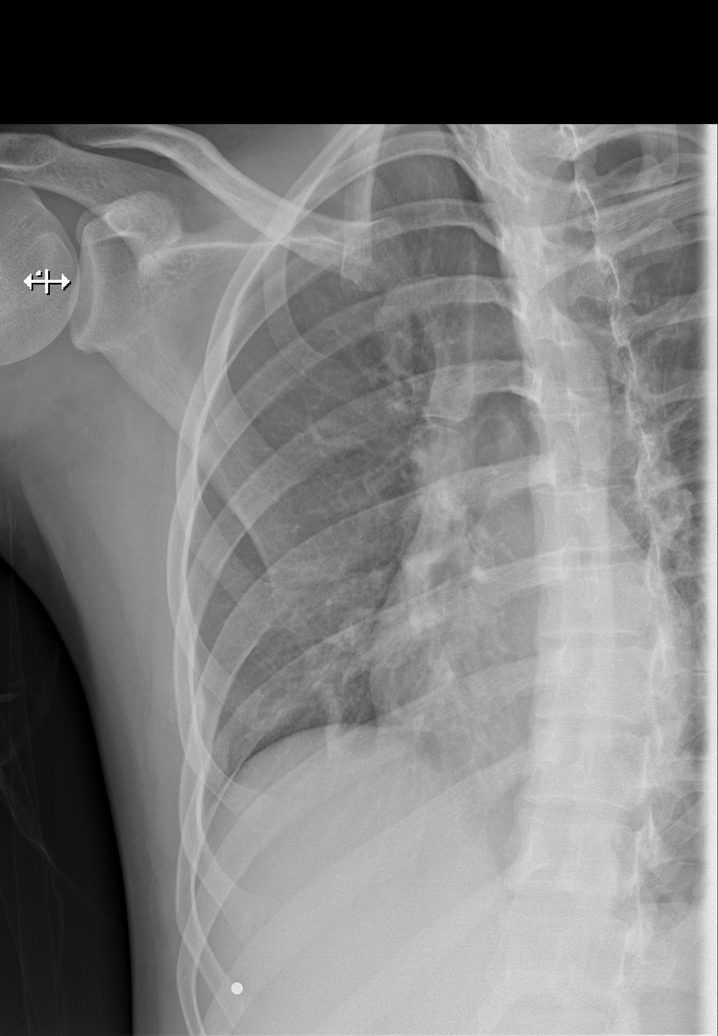

[5 of 5 positions shown; findings below may reference images not displayed]

FINDINGS: The lungs are clear. There is no pleural effusion or pneumothorax.
The cardiac silhouette is within normal limits. No acute osseous
pathology. No rib fracture.
IMPRESSION: Negative.
